# Patient Record
Sex: Male | Born: 1975 | Race: White | Hispanic: No | Marital: Single | Smoking: Current every day smoker
Health system: Southern US, Community
[De-identification: ages and names within clinical notes are randomized; demographics above are authoritative.]

---

## 2017-07-04 ENCOUNTER — Emergency Department (HOSPITAL_BASED_OUTPATIENT_CLINIC_OR_DEPARTMENT_OTHER): Payer: No Typology Code available for payment source

## 2017-07-04 ENCOUNTER — Other Ambulatory Visit: Payer: Self-pay

## 2017-07-04 ENCOUNTER — Emergency Department (HOSPITAL_BASED_OUTPATIENT_CLINIC_OR_DEPARTMENT_OTHER)
Admission: EM | Admit: 2017-07-04 | Discharge: 2017-07-04 | Disposition: A | Discharge status: Home / Self Care | Payer: No Typology Code available for payment source | Attending: Emergency Medicine | Admitting: Emergency Medicine

## 2017-07-04 ENCOUNTER — Encounter (HOSPITAL_BASED_OUTPATIENT_CLINIC_OR_DEPARTMENT_OTHER): Payer: Self-pay | Admitting: Emergency Medicine

## 2017-07-04 DIAGNOSIS — Y998 Other external cause status: Secondary | ICD-10-CM | POA: Insufficient documentation

## 2017-07-04 DIAGNOSIS — W2210XA Striking against or struck by unspecified automobile airbag, initial encounter: Secondary | ICD-10-CM | POA: Insufficient documentation

## 2017-07-04 DIAGNOSIS — Y92410 Unspecified street and highway as the place of occurrence of the external cause: Secondary | ICD-10-CM | POA: Diagnosis not present

## 2017-07-04 DIAGNOSIS — Y9389 Activity, other specified: Secondary | ICD-10-CM | POA: Insufficient documentation

## 2017-07-04 DIAGNOSIS — S32009A Unspecified fracture of unspecified lumbar vertebra, initial encounter for closed fracture: Secondary | ICD-10-CM | POA: Insufficient documentation

## 2017-07-04 DIAGNOSIS — F17200 Nicotine dependence, unspecified, uncomplicated: Secondary | ICD-10-CM | POA: Insufficient documentation

## 2017-07-04 DIAGNOSIS — S2221XA Fracture of manubrium, initial encounter for closed fracture: Secondary | ICD-10-CM | POA: Insufficient documentation

## 2017-07-04 DIAGNOSIS — S3992XA Unspecified injury of lower back, initial encounter: Secondary | ICD-10-CM | POA: Diagnosis present

## 2017-07-04 LAB — CBC WITH DIFFERENTIAL/PLATELET
Basophils Absolute: 0 10*3/uL (ref 0.0–0.1)
Basophils Relative: 0 %
EOS PCT: 2 %
Eosinophils Absolute: 0.2 10*3/uL (ref 0.0–0.7)
HCT: 45 % (ref 39.0–52.0)
HEMOGLOBIN: 16 g/dL (ref 13.0–17.0)
LYMPHS ABS: 2 10*3/uL (ref 0.7–4.0)
LYMPHS PCT: 20 %
MCH: 30.1 pg (ref 26.0–34.0)
MCHC: 35.6 g/dL (ref 30.0–36.0)
MCV: 84.7 fL (ref 78.0–100.0)
Monocytes Absolute: 0.9 10*3/uL (ref 0.1–1.0)
Monocytes Relative: 9 %
Neutro Abs: 7 10*3/uL (ref 1.7–7.7)
Neutrophils Relative %: 69 %
Platelets: 229 10*3/uL (ref 150–400)
RBC: 5.31 MIL/uL (ref 4.22–5.81)
RDW: 14 % (ref 11.5–15.5)
WBC: 10.2 10*3/uL (ref 4.0–10.5)

## 2017-07-04 LAB — COMPREHENSIVE METABOLIC PANEL
ALBUMIN: 4.1 g/dL (ref 3.5–5.0)
ALK PHOS: 60 U/L (ref 38–126)
ALT: 16 U/L — AB (ref 17–63)
AST: 24 U/L (ref 15–41)
Anion gap: 5 (ref 5–15)
BUN: 17 mg/dL (ref 6–20)
CALCIUM: 8.4 mg/dL — AB (ref 8.9–10.3)
CHLORIDE: 107 mmol/L (ref 101–111)
CO2: 24 mmol/L (ref 22–32)
CREATININE: 0.72 mg/dL (ref 0.61–1.24)
GFR calc non Af Amer: >60 mL/min (ref >60)
GLUCOSE: 97 mg/dL (ref 65–99)
Potassium: 3.9 mmol/L (ref 3.5–5.1)
SODIUM: 136 mmol/L (ref 135–145)
Total Bilirubin: 0.5 mg/dL (ref 0.3–1.2)
Total Protein: 6.9 g/dL (ref 6.5–8.1)

## 2017-07-04 MED ORDER — IBUPROFEN 800 MG PO TABS: 800.0000 mg | ORAL_TABLET | Freq: Three times a day (TID) | ORAL | 0 refills | Status: AC

## 2017-07-04 MED ORDER — CYCLOBENZAPRINE HCL 10 MG PO TABS: 10.0000 mg | ORAL_TABLET | Freq: Two times a day (BID) | ORAL | 0 refills | Status: AC | PRN

## 2017-07-04 MED ORDER — KETOROLAC TROMETHAMINE 15 MG/ML IJ SOLN
15.0000 mg | Freq: Once | INTRAMUSCULAR | Status: AC
  Administered 2017-07-04: 15 mg via INTRAVENOUS
  Filled 2017-07-04: qty 1

## 2017-07-04 MED ORDER — IOPAMIDOL (ISOVUE-300) INJECTION 61%
100.0000 mL | Freq: Once | INTRAVENOUS | Status: AC | PRN
  Administered 2017-07-04: 100 mL via INTRAVENOUS

## 2017-07-04 MED ORDER — SODIUM CHLORIDE 0.9 % IV BOLUS
500.0000 mL | Freq: Once | INTRAVENOUS | Status: AC
  Administered 2017-07-04: 500 mL via INTRAVENOUS

## 2017-07-04 MED ORDER — HYDROCODONE-ACETAMINOPHEN 5-325 MG PO TABS: 1.0000 | ORAL_TABLET | Freq: Four times a day (QID) | ORAL | 0 refills | Status: AC | PRN
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
## 2017-07-04 NOTE — ED Notes (Signed)
Patient transported to CT 

## 2017-07-07 ENCOUNTER — Telehealth (HOSPITAL_BASED_OUTPATIENT_CLINIC_OR_DEPARTMENT_OTHER): Payer: Self-pay | Admitting: Emergency Medicine

## 2017-07-07 NOTE — Telephone Encounter (Signed)
Pt called asking for work note from visit on 07/04/17. Work note written and left at [employer] for pt to pick up tomorrow.
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
